# Patient Record
Sex: Female | Born: 1965 | Race: Black or African American | Hispanic: No | Marital: Married
Health system: Southern US, Community
[De-identification: ages and names within clinical notes are randomized; demographics above are authoritative.]

---

## 1999-10-13 ENCOUNTER — Encounter: Admission: RE | Admit: 1999-10-13 | Discharge: 1999-10-13 | Payer: Self-pay

## 2004-11-30 ENCOUNTER — Encounter: Admission: RE | Admit: 2004-11-30 | Discharge: 2004-11-30 | Payer: Self-pay | Admitting: Family Medicine

## 2005-12-03 ENCOUNTER — Encounter: Admission: RE | Admit: 2005-12-03 | Discharge: 2005-12-03 | Payer: Self-pay | Admitting: Family Medicine

## 2005-12-19 ENCOUNTER — Encounter: Admission: RE | Admit: 2005-12-19 | Discharge: 2005-12-19 | Payer: Self-pay | Admitting: Family Medicine

## 2006-12-06 ENCOUNTER — Encounter: Admission: RE | Admit: 2006-12-06 | Discharge: 2006-12-06 | Payer: Self-pay | Admitting: Obstetrics & Gynecology

## 2007-12-08 ENCOUNTER — Encounter: Admission: RE | Admit: 2007-12-08 | Discharge: 2007-12-08 | Payer: Self-pay | Admitting: Family Medicine

## 2007-12-18 ENCOUNTER — Encounter: Admission: RE | Admit: 2007-12-18 | Discharge: 2007-12-18 | Payer: Self-pay | Admitting: Family Medicine

## 2007-12-18 ENCOUNTER — Encounter (INDEPENDENT_AMBULATORY_CARE_PROVIDER_SITE_OTHER): Payer: Self-pay | Admitting: Diagnostic Radiology

## 2008-12-08 ENCOUNTER — Encounter: Admission: RE | Admit: 2008-12-08 | Discharge: 2008-12-08 | Payer: Self-pay | Admitting: Family Medicine

## 2009-12-09 ENCOUNTER — Encounter: Admission: RE | Admit: 2009-12-09 | Discharge: 2009-12-09 | Payer: Self-pay | Admitting: Family Medicine

## 2010-12-12 ENCOUNTER — Other Ambulatory Visit: Payer: Self-pay | Admitting: Family Medicine

## 2010-12-12 DIAGNOSIS — Z1231 Encounter for screening mammogram for malignant neoplasm of breast: Secondary | ICD-10-CM

## 2010-12-18 ENCOUNTER — Ambulatory Visit: Payer: Self-pay

## 2010-12-25 ENCOUNTER — Ambulatory Visit
Admission: RE | Admit: 2010-12-25 | Discharge: 2010-12-25 | Disposition: A | Discharge status: Home / Self Care | Payer: BC Managed Care – PPO | Source: Ambulatory Visit | Attending: Family Medicine | Admitting: Family Medicine

## 2010-12-25 DIAGNOSIS — Z1231 Encounter for screening mammogram for malignant neoplasm of breast: Secondary | ICD-10-CM

## 2011-12-10 ENCOUNTER — Other Ambulatory Visit: Payer: Self-pay | Admitting: Family Medicine

## 2011-12-10 DIAGNOSIS — Z1231 Encounter for screening mammogram for malignant neoplasm of breast: Secondary | ICD-10-CM

## 2011-12-27 ENCOUNTER — Ambulatory Visit
Admission: RE | Admit: 2011-12-27 | Discharge: 2011-12-27 | Disposition: A | Discharge status: Home / Self Care | Payer: BC Managed Care – PPO | Source: Ambulatory Visit | Attending: Family Medicine | Admitting: Family Medicine

## 2011-12-27 DIAGNOSIS — Z1231 Encounter for screening mammogram for malignant neoplasm of breast: Secondary | ICD-10-CM

## 2012-12-03 ENCOUNTER — Other Ambulatory Visit: Payer: Self-pay

## 2012-12-03 DIAGNOSIS — Z1231 Encounter for screening mammogram for malignant neoplasm of breast: Secondary | ICD-10-CM

## 2012-12-29 ENCOUNTER — Ambulatory Visit
Admission: RE | Admit: 2012-12-29 | Discharge: 2012-12-29 | Disposition: A | Discharge status: Home / Self Care | Payer: BC Managed Care – PPO | Source: Ambulatory Visit

## 2012-12-29 DIAGNOSIS — Z1231 Encounter for screening mammogram for malignant neoplasm of breast: Secondary | ICD-10-CM

## 2013-06-11 HISTORY — PX: BREAST BIOPSY: SHX20

## 2013-11-30 ENCOUNTER — Other Ambulatory Visit: Payer: Self-pay

## 2013-11-30 DIAGNOSIS — Z1231 Encounter for screening mammogram for malignant neoplasm of breast: Secondary | ICD-10-CM

## 2013-12-30 ENCOUNTER — Encounter (INDEPENDENT_AMBULATORY_CARE_PROVIDER_SITE_OTHER): Payer: Self-pay

## 2013-12-30 ENCOUNTER — Ambulatory Visit
Admission: RE | Admit: 2013-12-30 | Discharge: 2013-12-30 | Disposition: A | Discharge status: Home / Self Care | Payer: BC Managed Care – PPO | Source: Ambulatory Visit

## 2013-12-30 DIAGNOSIS — Z1231 Encounter for screening mammogram for malignant neoplasm of breast: Secondary | ICD-10-CM

## 2013-12-31 ENCOUNTER — Other Ambulatory Visit: Payer: Self-pay | Admitting: Obstetrics and Gynecology

## 2013-12-31 DIAGNOSIS — R928 Other abnormal and inconclusive findings on diagnostic imaging of breast: Secondary | ICD-10-CM

## 2014-01-05 ENCOUNTER — Ambulatory Visit
Admission: RE | Admit: 2014-01-05 | Discharge: 2014-01-05 | Disposition: A | Discharge status: Home / Self Care | Payer: BC Managed Care – PPO | Source: Ambulatory Visit | Attending: Obstetrics and Gynecology | Admitting: Obstetrics and Gynecology

## 2014-01-05 ENCOUNTER — Other Ambulatory Visit: Payer: Self-pay | Admitting: Obstetrics and Gynecology

## 2014-01-05 DIAGNOSIS — R928 Other abnormal and inconclusive findings on diagnostic imaging of breast: Secondary | ICD-10-CM

## 2014-01-12 ENCOUNTER — Ambulatory Visit
Admission: RE | Admit: 2014-01-12 | Discharge: 2014-01-12 | Disposition: A | Discharge status: Home / Self Care | Payer: BC Managed Care – PPO | Source: Ambulatory Visit | Attending: Obstetrics and Gynecology | Admitting: Obstetrics and Gynecology

## 2014-01-12 DIAGNOSIS — R928 Other abnormal and inconclusive findings on diagnostic imaging of breast: Secondary | ICD-10-CM

## 2014-12-15 ENCOUNTER — Other Ambulatory Visit: Payer: Self-pay

## 2014-12-15 DIAGNOSIS — Z1231 Encounter for screening mammogram for malignant neoplasm of breast: Secondary | ICD-10-CM

## 2015-01-03 ENCOUNTER — Ambulatory Visit: Payer: BC Managed Care – PPO

## 2015-01-05 ENCOUNTER — Ambulatory Visit
Admission: RE | Admit: 2015-01-05 | Discharge: 2015-01-05 | Disposition: A | Discharge status: Home / Self Care | Payer: BC Managed Care – PPO | Source: Ambulatory Visit

## 2015-01-05 DIAGNOSIS — Z1231 Encounter for screening mammogram for malignant neoplasm of breast: Secondary | ICD-10-CM

## 2015-12-20 ENCOUNTER — Other Ambulatory Visit: Payer: Self-pay | Admitting: Obstetrics and Gynecology

## 2015-12-20 DIAGNOSIS — Z1231 Encounter for screening mammogram for malignant neoplasm of breast: Secondary | ICD-10-CM

## 2016-01-06 ENCOUNTER — Ambulatory Visit
Admission: RE | Admit: 2016-01-06 | Discharge: 2016-01-06 | Disposition: A | Discharge status: Home / Self Care | Payer: BC Managed Care – PPO | Source: Ambulatory Visit | Attending: Obstetrics and Gynecology | Admitting: Obstetrics and Gynecology

## 2016-01-06 DIAGNOSIS — Z1231 Encounter for screening mammogram for malignant neoplasm of breast: Secondary | ICD-10-CM

## 2016-11-30 ENCOUNTER — Other Ambulatory Visit: Payer: Self-pay | Admitting: Obstetrics and Gynecology

## 2016-11-30 DIAGNOSIS — Z1231 Encounter for screening mammogram for malignant neoplasm of breast: Secondary | ICD-10-CM

## 2017-01-07 ENCOUNTER — Encounter: Payer: Self-pay | Admitting: Radiology

## 2017-01-07 ENCOUNTER — Ambulatory Visit
Admission: RE | Admit: 2017-01-07 | Discharge: 2017-01-07 | Disposition: A | Discharge status: Home / Self Care | Payer: BC Managed Care – PPO | Source: Ambulatory Visit | Attending: Obstetrics and Gynecology | Admitting: Obstetrics and Gynecology

## 2017-01-07 DIAGNOSIS — Z1231 Encounter for screening mammogram for malignant neoplasm of breast: Secondary | ICD-10-CM

## 2017-12-05 ENCOUNTER — Other Ambulatory Visit: Payer: Self-pay | Admitting: Obstetrics and Gynecology

## 2017-12-05 DIAGNOSIS — Z1231 Encounter for screening mammogram for malignant neoplasm of breast: Secondary | ICD-10-CM

## 2018-01-14 ENCOUNTER — Ambulatory Visit
Admission: RE | Admit: 2018-01-14 | Discharge: 2018-01-14 | Disposition: A | Discharge status: Home / Self Care | Payer: BC Managed Care – PPO | Source: Ambulatory Visit | Attending: Obstetrics and Gynecology | Admitting: Obstetrics and Gynecology

## 2018-01-14 DIAGNOSIS — Z1231 Encounter for screening mammogram for malignant neoplasm of breast: Secondary | ICD-10-CM

## 2018-11-10 ENCOUNTER — Other Ambulatory Visit: Payer: Self-pay | Admitting: Obstetrics and Gynecology

## 2018-11-10 DIAGNOSIS — Z9289 Personal history of other medical treatment: Secondary | ICD-10-CM

## 2019-01-16 ENCOUNTER — Other Ambulatory Visit: Payer: Self-pay

## 2019-01-16 ENCOUNTER — Ambulatory Visit
Admission: RE | Admit: 2019-01-16 | Discharge: 2019-01-16 | Disposition: A | Discharge status: Home / Self Care | Payer: BC Managed Care – PPO | Source: Ambulatory Visit | Attending: Obstetrics and Gynecology | Admitting: Obstetrics and Gynecology

## 2019-01-16 DIAGNOSIS — Z9289 Personal history of other medical treatment: Secondary | ICD-10-CM

## 2019-11-30 ENCOUNTER — Other Ambulatory Visit: Payer: Self-pay | Admitting: Obstetrics and Gynecology

## 2019-11-30 DIAGNOSIS — Z1231 Encounter for screening mammogram for malignant neoplasm of breast: Secondary | ICD-10-CM

## 2020-01-18 ENCOUNTER — Ambulatory Visit
Admission: RE | Admit: 2020-01-18 | Discharge: 2020-01-18 | Disposition: A | Discharge status: Home / Self Care | Payer: BC Managed Care – PPO | Source: Ambulatory Visit | Attending: Obstetrics and Gynecology | Admitting: Obstetrics and Gynecology

## 2020-01-18 ENCOUNTER — Other Ambulatory Visit: Payer: Self-pay

## 2020-01-18 DIAGNOSIS — Z1231 Encounter for screening mammogram for malignant neoplasm of breast: Secondary | ICD-10-CM

## 2020-12-05 ENCOUNTER — Other Ambulatory Visit: Payer: Self-pay | Admitting: Obstetrics and Gynecology

## 2020-12-05 DIAGNOSIS — Z1231 Encounter for screening mammogram for malignant neoplasm of breast: Secondary | ICD-10-CM

## 2021-01-18 ENCOUNTER — Ambulatory Visit
Admission: RE | Admit: 2021-01-18 | Discharge: 2021-01-18 | Disposition: A | Discharge status: Home / Self Care | Payer: BC Managed Care – PPO | Source: Ambulatory Visit | Attending: Obstetrics and Gynecology | Admitting: Obstetrics and Gynecology

## 2021-01-18 ENCOUNTER — Other Ambulatory Visit: Payer: Self-pay

## 2021-01-18 DIAGNOSIS — Z1231 Encounter for screening mammogram for malignant neoplasm of breast: Secondary | ICD-10-CM

## 2021-01-26 ENCOUNTER — Ambulatory Visit: Payer: BC Managed Care – PPO

## 2021-02-01 ENCOUNTER — Ambulatory Visit: Payer: BC Managed Care – PPO

## 2021-08-01 IMAGING — MG DIGITAL SCREENING BILAT W/ TOMO W/ CAD
8 series · 8 of 24 positions shown · non-contrast
Comparison: Previous exam(s).

CLINICAL DATA: Screening.

EXAM:
DIGITAL SCREENING BILATERAL MAMMOGRAM WITH TOMO AND CAD

[L CC synth-2D]
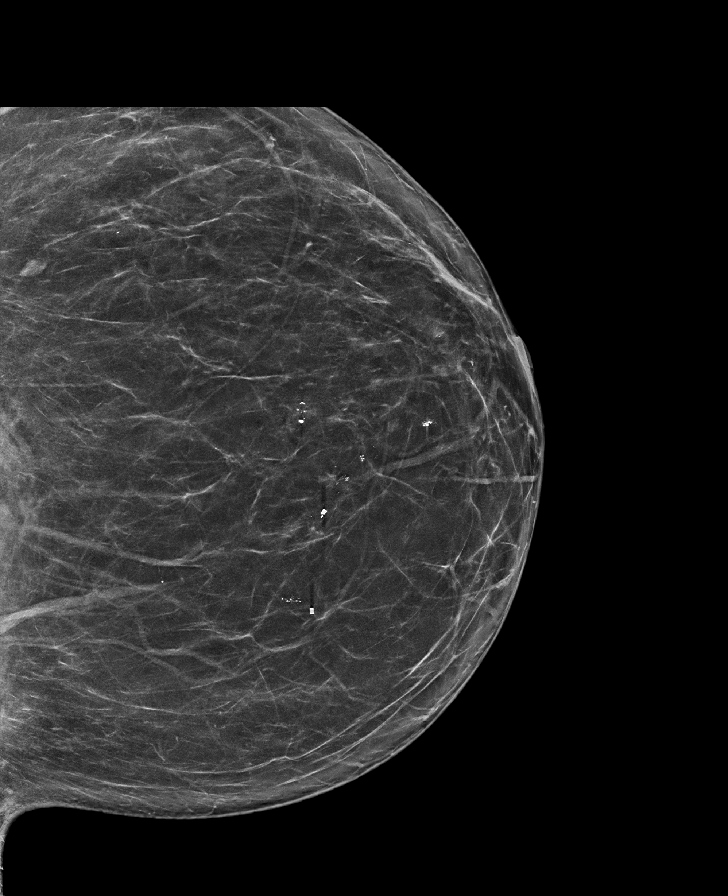

[R CC synth-2D]
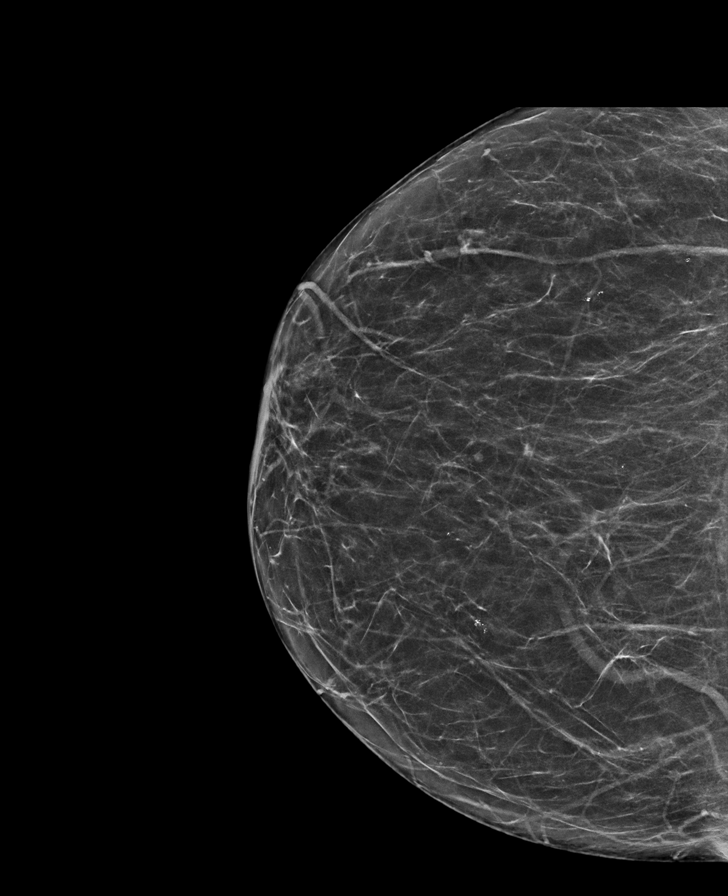

[R MLO synth-2D]
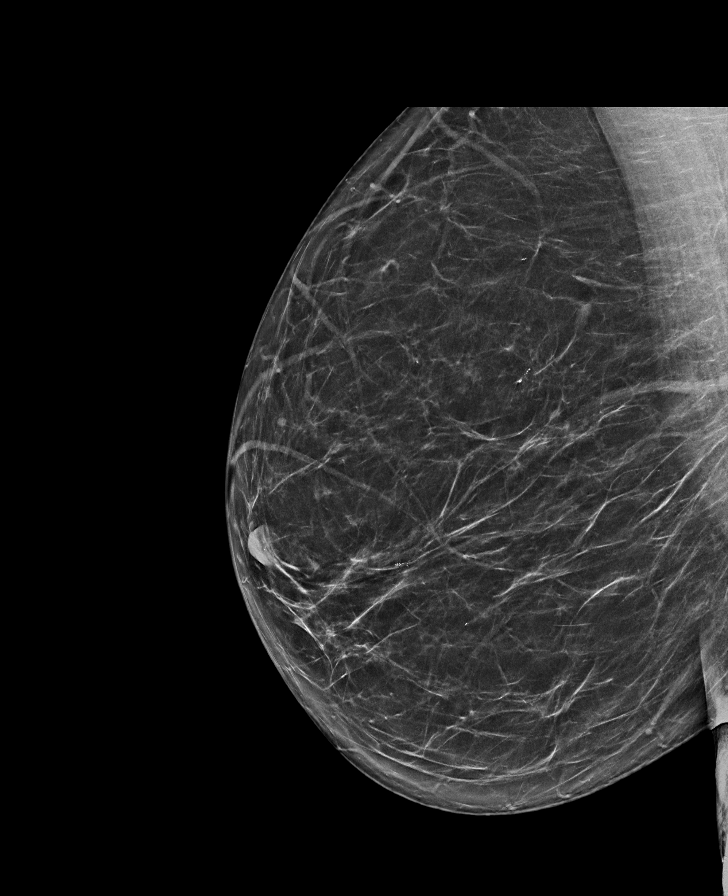

[L MLO synth-2D]
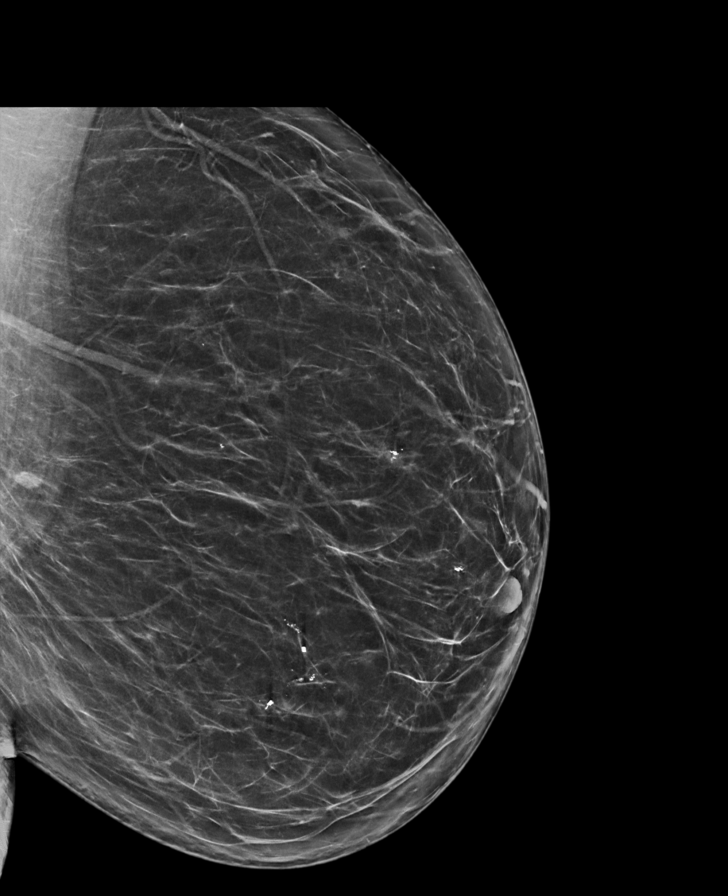

[R CC tomo · tomo slice 33/66.0]
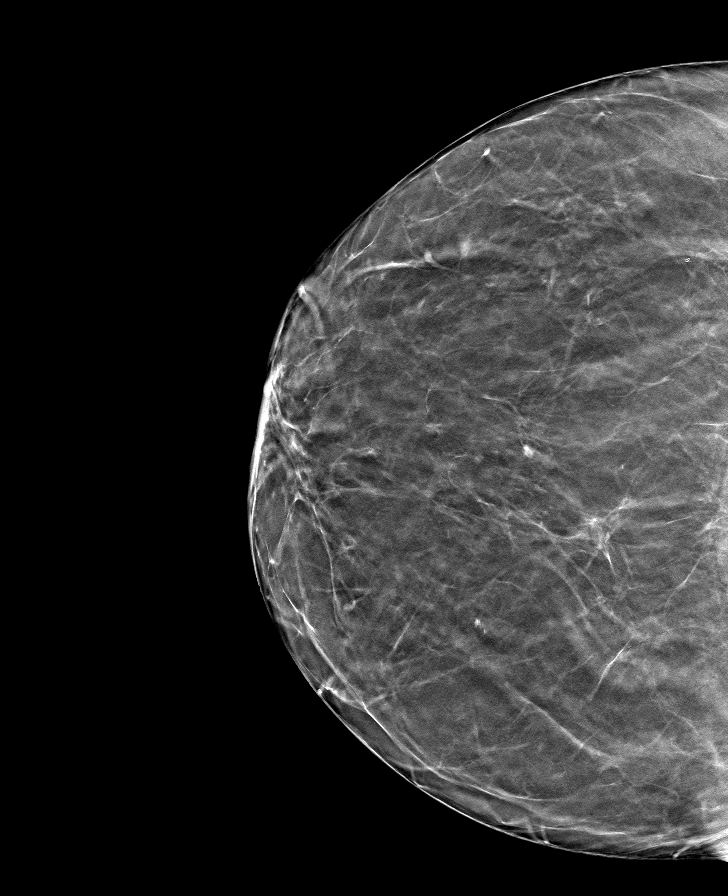

[R MLO tomo · tomo slice 38/75.0]
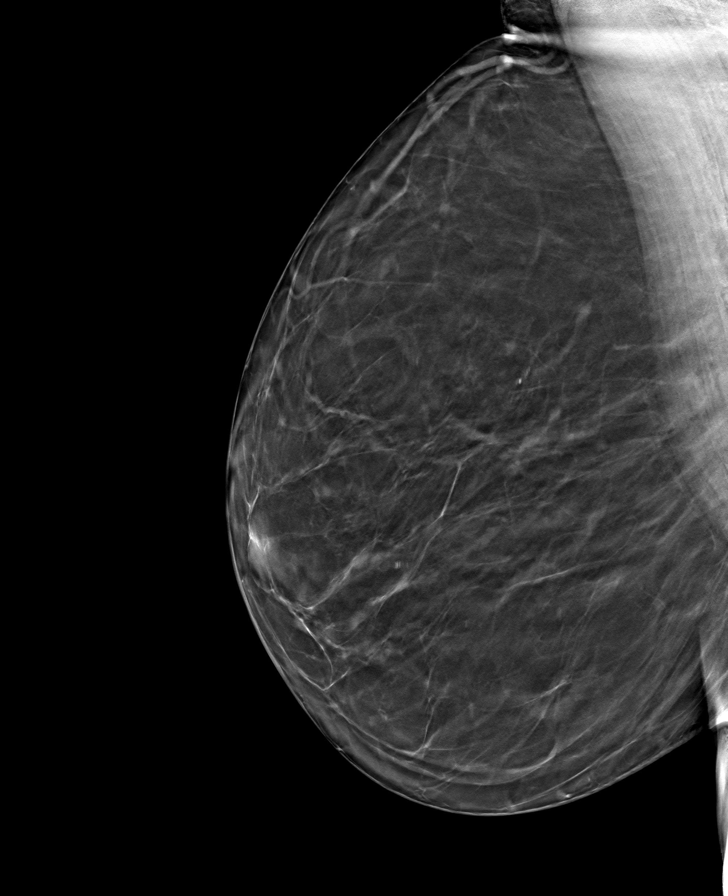

[L CC tomo · tomo slice 41/81.0]
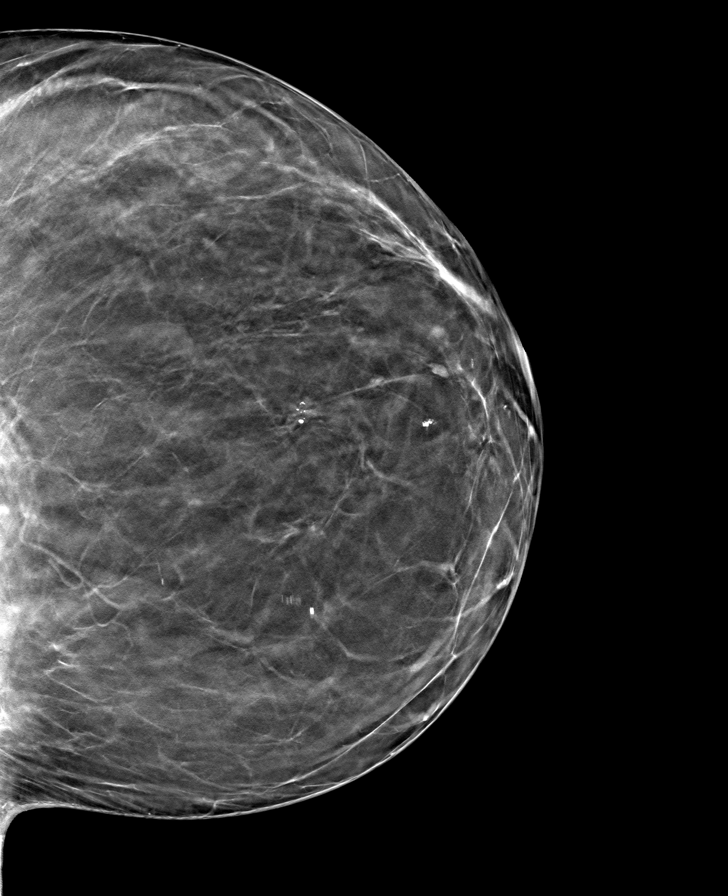

[L MLO tomo · tomo slice 43/85.0]
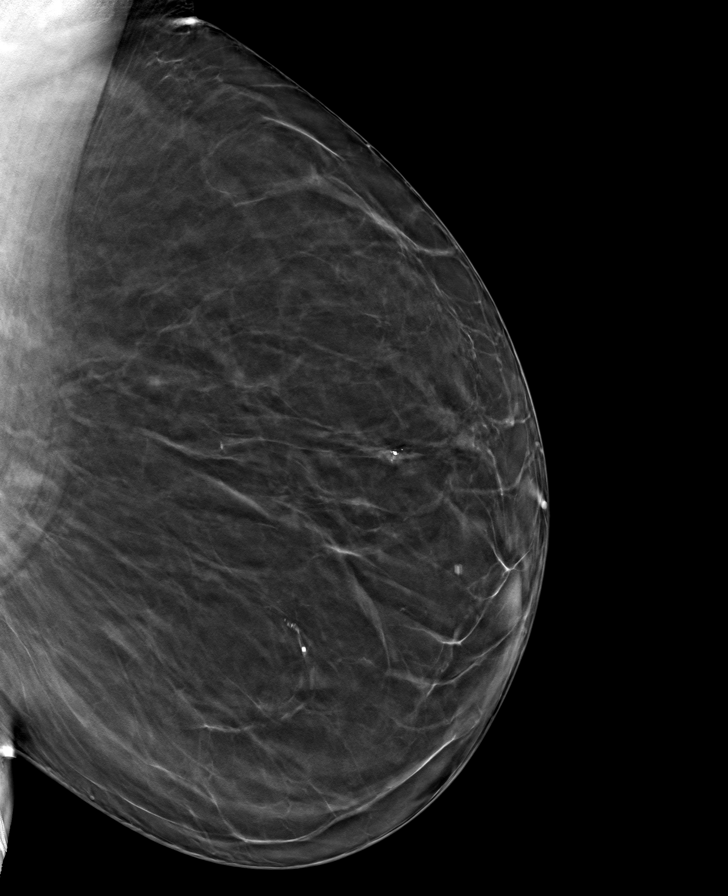

[8 of 24 positions shown; findings below may reference images not displayed]

ACR Breast Density Category b: There are scattered areas of
fibroglandular density.
FINDINGS: There are no findings suspicious for malignancy. Images were
processed with CAD.
IMPRESSION: No mammographic evidence of malignancy. A result letter of this
screening mammogram will be mailed directly to the patient.

RECOMMENDATION:
Screening mammogram in one year. (Code:[id number])

BI-RADS CATEGORY  1: Negative.

## 2021-12-06 ENCOUNTER — Other Ambulatory Visit: Payer: Self-pay | Admitting: Obstetrics and Gynecology

## 2021-12-06 DIAGNOSIS — Z1231 Encounter for screening mammogram for malignant neoplasm of breast: Secondary | ICD-10-CM

## 2022-01-19 ENCOUNTER — Ambulatory Visit
Admission: RE | Admit: 2022-01-19 | Discharge: 2022-01-19 | Disposition: A | Discharge status: Home / Self Care | Payer: BC Managed Care – PPO | Source: Ambulatory Visit | Attending: Obstetrics and Gynecology | Admitting: Obstetrics and Gynecology

## 2022-01-19 DIAGNOSIS — Z1231 Encounter for screening mammogram for malignant neoplasm of breast: Secondary | ICD-10-CM

## 2022-10-23 ENCOUNTER — Other Ambulatory Visit: Payer: Self-pay | Admitting: Obstetrics and Gynecology

## 2022-10-23 DIAGNOSIS — Z1231 Encounter for screening mammogram for malignant neoplasm of breast: Secondary | ICD-10-CM

## 2023-01-21 ENCOUNTER — Ambulatory Visit
Admission: RE | Admit: 2023-01-21 | Discharge: 2023-01-21 | Disposition: A | Discharge status: Home / Self Care | Payer: BC Managed Care – PPO | Source: Ambulatory Visit | Attending: Obstetrics and Gynecology | Admitting: Obstetrics and Gynecology

## 2023-01-21 DIAGNOSIS — Z1231 Encounter for screening mammogram for malignant neoplasm of breast: Secondary | ICD-10-CM

## 2023-01-23 ENCOUNTER — Ambulatory Visit: Payer: BC Managed Care – PPO

## 2023-11-18 ENCOUNTER — Other Ambulatory Visit: Payer: Self-pay | Admitting: Obstetrics and Gynecology

## 2023-11-18 DIAGNOSIS — Z1231 Encounter for screening mammogram for malignant neoplasm of breast: Secondary | ICD-10-CM

## 2024-01-22 ENCOUNTER — Ambulatory Visit
Admission: RE | Admit: 2024-01-22 | Discharge: 2024-01-22 | Disposition: A | Discharge status: Home / Self Care | Payer: Self-pay | Source: Ambulatory Visit | Attending: Obstetrics and Gynecology | Admitting: Obstetrics and Gynecology

## 2024-01-22 DIAGNOSIS — Z1231 Encounter for screening mammogram for malignant neoplasm of breast: Secondary | ICD-10-CM
